# Patient Record
Sex: Female | Born: 1955
Health system: Southern US, Community
[De-identification: ages and names within clinical notes are randomized; demographics above are authoritative.]

---

## 1999-07-12 ENCOUNTER — Other Ambulatory Visit: Admission: RE | Admit: 1999-07-12 | Discharge: 1999-07-12 | Payer: Self-pay | Admitting: Obstetrics and Gynecology

## 2001-07-19 ENCOUNTER — Encounter: Admission: RE | Admit: 2001-07-19 | Discharge: 2001-07-19 | Payer: Self-pay | Admitting: Obstetrics and Gynecology

## 2001-07-19 ENCOUNTER — Encounter: Payer: Self-pay | Admitting: Obstetrics and Gynecology

## 2001-10-22 ENCOUNTER — Encounter: Admission: RE | Admit: 2001-10-22 | Discharge: 2002-01-20 | Payer: Self-pay | Admitting: Obstetrics and Gynecology

## 2002-08-05 ENCOUNTER — Encounter: Admission: RE | Admit: 2002-08-05 | Discharge: 2002-08-05 | Payer: Self-pay | Admitting: Obstetrics and Gynecology

## 2002-08-05 ENCOUNTER — Encounter: Payer: Self-pay | Admitting: Obstetrics and Gynecology

## 2003-09-23 ENCOUNTER — Encounter: Admission: RE | Admit: 2003-09-23 | Discharge: 2003-09-23 | Payer: Self-pay | Admitting: Obstetrics and Gynecology

## 2003-09-23 ENCOUNTER — Encounter: Payer: Self-pay | Admitting: Obstetrics and Gynecology

## 2004-04-11 ENCOUNTER — Other Ambulatory Visit: Admission: RE | Admit: 2004-04-11 | Discharge: 2004-04-11 | Payer: Self-pay | Admitting: Obstetrics and Gynecology

## 2005-02-06 ENCOUNTER — Encounter: Admission: RE | Admit: 2005-02-06 | Discharge: 2005-02-06 | Payer: Self-pay | Admitting: Obstetrics and Gynecology

## 2005-04-17 ENCOUNTER — Other Ambulatory Visit: Admission: RE | Admit: 2005-04-17 | Discharge: 2005-04-17 | Payer: Self-pay | Admitting: Obstetrics and Gynecology

## 2005-08-18 ENCOUNTER — Encounter: Admission: RE | Admit: 2005-08-18 | Discharge: 2005-11-16 | Payer: Self-pay | Admitting: Family Medicine

## 2006-02-13 ENCOUNTER — Encounter: Admission: RE | Admit: 2006-02-13 | Discharge: 2006-02-13 | Payer: Self-pay | Admitting: Obstetrics and Gynecology

## 2006-04-17 ENCOUNTER — Other Ambulatory Visit: Admission: RE | Admit: 2006-04-17 | Discharge: 2006-04-17 | Payer: Self-pay | Admitting: Obstetrics and Gynecology

## 2007-02-20 ENCOUNTER — Encounter: Admission: RE | Admit: 2007-02-20 | Discharge: 2007-02-20 | Payer: Self-pay | Admitting: Obstetrics and Gynecology

## 2007-04-23 ENCOUNTER — Other Ambulatory Visit: Admission: RE | Admit: 2007-04-23 | Discharge: 2007-04-23 | Payer: Self-pay | Admitting: Obstetrics and Gynecology

## 2008-02-13 ENCOUNTER — Encounter: Payer: Self-pay | Admitting: Endocrinology

## 2008-02-18 ENCOUNTER — Encounter: Payer: Self-pay | Admitting: Endocrinology

## 2008-03-12 DIAGNOSIS — G609 Hereditary and idiopathic neuropathy, unspecified: Secondary | ICD-10-CM | POA: Insufficient documentation

## 2008-03-12 DIAGNOSIS — E785 Hyperlipidemia, unspecified: Secondary | ICD-10-CM | POA: Insufficient documentation

## 2008-03-13 ENCOUNTER — Encounter: Payer: Self-pay | Admitting: Endocrinology

## 2008-03-13 ENCOUNTER — Other Ambulatory Visit: Admission: RE | Admit: 2008-03-13 | Discharge: 2008-03-13 | Payer: Self-pay | Admitting: Endocrinology

## 2008-03-13 ENCOUNTER — Ambulatory Visit: Payer: Self-pay | Admitting: Endocrinology

## 2008-03-13 DIAGNOSIS — E079 Disorder of thyroid, unspecified: Secondary | ICD-10-CM | POA: Insufficient documentation

## 2008-03-17 ENCOUNTER — Encounter (INDEPENDENT_AMBULATORY_CARE_PROVIDER_SITE_OTHER): Payer: Self-pay | Admitting: *Deleted

## 2008-03-18 ENCOUNTER — Telehealth: Payer: Self-pay | Admitting: Endocrinology

## 2008-03-31 ENCOUNTER — Encounter: Admission: RE | Admit: 2008-03-31 | Discharge: 2008-03-31 | Payer: Self-pay | Admitting: Obstetrics and Gynecology

## 2008-04-23 ENCOUNTER — Other Ambulatory Visit: Admission: RE | Admit: 2008-04-23 | Discharge: 2008-04-23 | Payer: Self-pay | Admitting: Obstetrics and Gynecology

## 2008-05-05 ENCOUNTER — Encounter: Payer: Self-pay | Admitting: Endocrinology

## 2008-09-29 ENCOUNTER — Encounter: Admission: RE | Admit: 2008-09-29 | Discharge: 2008-09-29 | Payer: Self-pay | Admitting: Surgery

## 2008-10-13 ENCOUNTER — Encounter: Payer: Self-pay | Admitting: Endocrinology

## 2009-04-05 ENCOUNTER — Encounter: Admission: RE | Admit: 2009-04-05 | Discharge: 2009-04-05 | Payer: Self-pay | Admitting: Internal Medicine

## 2009-04-26 ENCOUNTER — Other Ambulatory Visit: Admission: RE | Admit: 2009-04-26 | Discharge: 2009-04-26 | Payer: Self-pay | Admitting: Obstetrics and Gynecology

## 2009-05-03 ENCOUNTER — Encounter: Admission: RE | Admit: 2009-05-03 | Discharge: 2009-05-03 | Payer: Self-pay | Admitting: Obstetrics and Gynecology

## 2009-09-20 ENCOUNTER — Encounter: Admission: RE | Admit: 2009-09-20 | Discharge: 2009-09-20 | Payer: Self-pay | Admitting: Internal Medicine

## 2010-04-13 ENCOUNTER — Emergency Department (HOSPITAL_COMMUNITY): Admission: EM | Admit: 2010-04-13 | Discharge: 2010-04-13 | Payer: Self-pay | Admitting: Emergency Medicine

## 2010-05-30 ENCOUNTER — Other Ambulatory Visit: Admission: RE | Admit: 2010-05-30 | Discharge: 2010-05-30 | Payer: Self-pay | Admitting: Obstetrics and Gynecology

## 2010-05-30 ENCOUNTER — Encounter: Admission: RE | Admit: 2010-05-30 | Discharge: 2010-05-30 | Payer: Self-pay | Admitting: Obstetrics and Gynecology

## 2010-08-30 ENCOUNTER — Encounter: Admission: RE | Admit: 2010-08-30 | Discharge: 2010-08-30 | Payer: Self-pay | Admitting: Internal Medicine

## 2011-03-14 LAB — DIFFERENTIAL
Basophils Relative: 1 % (ref 0–1)
Eosinophils Relative: 2 % (ref 0–5)
Lymphocytes Relative: 31 % (ref 12–46)
Lymphs Abs: 1.5 10*3/uL (ref 0.7–4.0)
Monocytes Relative: 12 % (ref 3–12)
Neutro Abs: 2.6 10*3/uL (ref 1.7–7.7)

## 2011-03-14 LAB — BASIC METABOLIC PANEL
BUN: 8 mg/dL (ref 6–23)
Chloride: 109 mEq/L (ref 96–112)
Creatinine, Ser: 0.88 mg/dL (ref 0.4–1.2)

## 2011-03-14 LAB — POCT CARDIAC MARKERS
CKMB, poc: 1.1 ng/mL (ref 1.0–8.0)
CKMB, poc: 2.5 ng/mL (ref 1.0–8.0)
Myoglobin, poc: 69.5 ng/mL (ref 12–200)
Myoglobin, poc: 75.9 ng/mL (ref 12–200)

## 2011-03-14 LAB — CBC
MCV: 86.6 fL (ref 78.0–100.0)
RDW: 13.2 % (ref 11.5–15.5)

## 2011-06-01 ENCOUNTER — Other Ambulatory Visit: Payer: Self-pay | Admitting: Obstetrics and Gynecology

## 2011-06-01 ENCOUNTER — Other Ambulatory Visit (HOSPITAL_COMMUNITY)
Admission: RE | Admit: 2011-06-01 | Discharge: 2011-06-01 | Disposition: A | Discharge status: Home / Self Care | Payer: BC Managed Care – PPO | Source: Ambulatory Visit | Attending: Obstetrics and Gynecology | Admitting: Obstetrics and Gynecology

## 2011-06-01 DIAGNOSIS — Z01419 Encounter for gynecological examination (general) (routine) without abnormal findings: Secondary | ICD-10-CM | POA: Insufficient documentation

## 2011-06-12 ENCOUNTER — Other Ambulatory Visit: Payer: Self-pay | Admitting: Obstetrics and Gynecology

## 2011-06-12 DIAGNOSIS — Z1231 Encounter for screening mammogram for malignant neoplasm of breast: Secondary | ICD-10-CM

## 2011-06-19 ENCOUNTER — Ambulatory Visit
Admission: RE | Admit: 2011-06-19 | Discharge: 2011-06-19 | Disposition: A | Discharge status: Home / Self Care | Payer: BC Managed Care – PPO | Source: Ambulatory Visit | Attending: Obstetrics and Gynecology | Admitting: Obstetrics and Gynecology

## 2011-06-19 DIAGNOSIS — Z1231 Encounter for screening mammogram for malignant neoplasm of breast: Secondary | ICD-10-CM

## 2011-09-11 ENCOUNTER — Other Ambulatory Visit: Payer: Self-pay | Admitting: Internal Medicine

## 2011-09-11 DIAGNOSIS — E041 Nontoxic single thyroid nodule: Secondary | ICD-10-CM

## 2011-09-19 ENCOUNTER — Ambulatory Visit
Admission: RE | Admit: 2011-09-19 | Discharge: 2011-09-19 | Disposition: A | Discharge status: Home / Self Care | Payer: BC Managed Care – PPO | Source: Ambulatory Visit | Attending: Internal Medicine | Admitting: Internal Medicine

## 2011-09-19 DIAGNOSIS — E041 Nontoxic single thyroid nodule: Secondary | ICD-10-CM

## 2012-05-14 ENCOUNTER — Other Ambulatory Visit: Payer: Self-pay | Admitting: Obstetrics and Gynecology

## 2012-05-14 DIAGNOSIS — Z1231 Encounter for screening mammogram for malignant neoplasm of breast: Secondary | ICD-10-CM

## 2012-06-04 ENCOUNTER — Other Ambulatory Visit (HOSPITAL_COMMUNITY)
Admission: RE | Admit: 2012-06-04 | Discharge: 2012-06-04 | Disposition: A | Discharge status: Home / Self Care | Payer: BC Managed Care – PPO | Source: Ambulatory Visit | Attending: Obstetrics and Gynecology | Admitting: Obstetrics and Gynecology

## 2012-06-04 ENCOUNTER — Other Ambulatory Visit: Payer: Self-pay | Admitting: Obstetrics and Gynecology

## 2012-06-04 DIAGNOSIS — Z01419 Encounter for gynecological examination (general) (routine) without abnormal findings: Secondary | ICD-10-CM | POA: Insufficient documentation

## 2012-06-24 ENCOUNTER — Ambulatory Visit: Payer: BC Managed Care – PPO

## 2012-06-24 ENCOUNTER — Ambulatory Visit
Admission: RE | Admit: 2012-06-24 | Discharge: 2012-06-24 | Disposition: A | Discharge status: Home / Self Care | Payer: BC Managed Care – PPO | Source: Ambulatory Visit | Attending: Obstetrics and Gynecology | Admitting: Obstetrics and Gynecology

## 2012-06-24 DIAGNOSIS — Z1231 Encounter for screening mammogram for malignant neoplasm of breast: Secondary | ICD-10-CM

## 2013-05-27 ENCOUNTER — Other Ambulatory Visit: Payer: Self-pay

## 2013-05-27 DIAGNOSIS — Z1231 Encounter for screening mammogram for malignant neoplasm of breast: Secondary | ICD-10-CM

## 2013-06-30 ENCOUNTER — Ambulatory Visit
Admission: RE | Admit: 2013-06-30 | Discharge: 2013-06-30 | Disposition: A | Discharge status: Home / Self Care | Payer: BC Managed Care – PPO | Source: Ambulatory Visit

## 2013-06-30 DIAGNOSIS — Z1231 Encounter for screening mammogram for malignant neoplasm of breast: Secondary | ICD-10-CM

## 2013-10-14 ENCOUNTER — Other Ambulatory Visit: Payer: Self-pay | Admitting: Internal Medicine

## 2013-10-14 DIAGNOSIS — E041 Nontoxic single thyroid nodule: Secondary | ICD-10-CM

## 2013-10-29 ENCOUNTER — Ambulatory Visit
Admission: RE | Admit: 2013-10-29 | Discharge: 2013-10-29 | Disposition: A | Discharge status: Home / Self Care | Payer: BC Managed Care – PPO | Source: Ambulatory Visit | Attending: Internal Medicine | Admitting: Internal Medicine

## 2013-10-29 DIAGNOSIS — E041 Nontoxic single thyroid nodule: Secondary | ICD-10-CM

## 2014-06-05 ENCOUNTER — Other Ambulatory Visit: Payer: Self-pay

## 2014-06-05 DIAGNOSIS — Z1231 Encounter for screening mammogram for malignant neoplasm of breast: Secondary | ICD-10-CM

## 2014-07-06 ENCOUNTER — Ambulatory Visit
Admission: RE | Admit: 2014-07-06 | Discharge: 2014-07-06 | Disposition: A | Discharge status: Home / Self Care | Payer: BC Managed Care – PPO | Source: Ambulatory Visit

## 2014-07-06 DIAGNOSIS — Z1231 Encounter for screening mammogram for malignant neoplasm of breast: Secondary | ICD-10-CM

## 2014-09-16 ENCOUNTER — Other Ambulatory Visit: Payer: Self-pay

## 2015-06-17 ENCOUNTER — Other Ambulatory Visit: Payer: Self-pay

## 2015-06-17 DIAGNOSIS — Z1231 Encounter for screening mammogram for malignant neoplasm of breast: Secondary | ICD-10-CM

## 2015-07-08 ENCOUNTER — Other Ambulatory Visit (HOSPITAL_COMMUNITY)
Admission: RE | Admit: 2015-07-08 | Discharge: 2015-07-08 | Disposition: A | Discharge status: Home / Self Care | Payer: BLUE CROSS/BLUE SHIELD | Source: Ambulatory Visit | Attending: Nurse Practitioner | Admitting: Nurse Practitioner

## 2015-07-08 ENCOUNTER — Ambulatory Visit
Admission: RE | Admit: 2015-07-08 | Discharge: 2015-07-08 | Disposition: A | Discharge status: Home / Self Care | Payer: BLUE CROSS/BLUE SHIELD | Source: Ambulatory Visit

## 2015-07-08 ENCOUNTER — Other Ambulatory Visit: Payer: Self-pay | Admitting: Nurse Practitioner

## 2015-07-08 DIAGNOSIS — Z1231 Encounter for screening mammogram for malignant neoplasm of breast: Secondary | ICD-10-CM

## 2015-07-08 DIAGNOSIS — Z01419 Encounter for gynecological examination (general) (routine) without abnormal findings: Secondary | ICD-10-CM | POA: Insufficient documentation

## 2015-07-08 DIAGNOSIS — Z1151 Encounter for screening for human papillomavirus (HPV): Secondary | ICD-10-CM | POA: Insufficient documentation

## 2015-07-12 LAB — CYTOLOGY - PAP

## 2016-06-15 ENCOUNTER — Other Ambulatory Visit: Payer: Self-pay | Admitting: Family Medicine

## 2016-06-15 DIAGNOSIS — Z1231 Encounter for screening mammogram for malignant neoplasm of breast: Secondary | ICD-10-CM

## 2016-07-11 ENCOUNTER — Ambulatory Visit
Admission: RE | Admit: 2016-07-11 | Discharge: 2016-07-11 | Disposition: A | Discharge status: Home / Self Care | Payer: BLUE CROSS/BLUE SHIELD | Source: Ambulatory Visit | Attending: Family Medicine | Admitting: Family Medicine

## 2016-07-11 DIAGNOSIS — Z1231 Encounter for screening mammogram for malignant neoplasm of breast: Secondary | ICD-10-CM | POA: Diagnosis not present

## 2016-07-11 DIAGNOSIS — Z01419 Encounter for gynecological examination (general) (routine) without abnormal findings: Secondary | ICD-10-CM | POA: Diagnosis not present

## 2016-09-22 DIAGNOSIS — E559 Vitamin D deficiency, unspecified: Secondary | ICD-10-CM | POA: Diagnosis not present

## 2016-09-22 DIAGNOSIS — Z79899 Other long term (current) drug therapy: Secondary | ICD-10-CM | POA: Diagnosis not present

## 2016-09-22 DIAGNOSIS — Z0100 Encounter for examination of eyes and vision without abnormal findings: Secondary | ICD-10-CM | POA: Diagnosis not present

## 2016-09-22 DIAGNOSIS — E7800 Pure hypercholesterolemia, unspecified: Secondary | ICD-10-CM | POA: Diagnosis not present

## 2016-09-22 DIAGNOSIS — J019 Acute sinusitis, unspecified: Secondary | ICD-10-CM | POA: Diagnosis not present

## 2016-10-17 ENCOUNTER — Other Ambulatory Visit: Payer: Self-pay | Admitting: Internal Medicine

## 2016-10-17 DIAGNOSIS — E063 Autoimmune thyroiditis: Secondary | ICD-10-CM

## 2016-10-17 DIAGNOSIS — E041 Nontoxic single thyroid nodule: Secondary | ICD-10-CM

## 2016-10-27 ENCOUNTER — Ambulatory Visit
Admission: RE | Admit: 2016-10-27 | Discharge: 2016-10-27 | Disposition: A | Discharge status: Home / Self Care | Payer: BLUE CROSS/BLUE SHIELD | Source: Ambulatory Visit | Attending: Internal Medicine | Admitting: Internal Medicine

## 2016-10-27 DIAGNOSIS — E041 Nontoxic single thyroid nodule: Secondary | ICD-10-CM | POA: Diagnosis not present

## 2016-10-27 DIAGNOSIS — E063 Autoimmune thyroiditis: Secondary | ICD-10-CM

## 2016-11-28 DIAGNOSIS — E041 Nontoxic single thyroid nodule: Secondary | ICD-10-CM | POA: Diagnosis not present

## 2016-11-28 DIAGNOSIS — E063 Autoimmune thyroiditis: Secondary | ICD-10-CM | POA: Diagnosis not present

## 2016-11-28 DIAGNOSIS — E039 Hypothyroidism, unspecified: Secondary | ICD-10-CM | POA: Diagnosis not present

## 2017-02-09 ENCOUNTER — Encounter: Payer: Self-pay | Admitting: Podiatry

## 2017-02-09 ENCOUNTER — Ambulatory Visit (INDEPENDENT_AMBULATORY_CARE_PROVIDER_SITE_OTHER): Payer: BLUE CROSS/BLUE SHIELD | Admitting: Podiatry

## 2017-02-09 VITALS — Ht 65.0 in | Wt 142.0 lb

## 2017-02-09 DIAGNOSIS — L600 Ingrowing nail: Secondary | ICD-10-CM

## 2017-02-09 NOTE — Patient Instructions (Signed)

## 2017-02-10 NOTE — Progress Notes (Signed)
Subjective:     Patient ID: Debbie Cooper, female   DOB: 02/15/1956, 61 y.o.   MRN: [id number]  HPI patient presents with a chronic ingrown toenail right hallux medial border that she states is been present along time and she's tried to soak and trimming herself without results   Review of Systems  All other systems reviewed and are negative.      Objective:   Physical Exam  Constitutional: She is oriented to person, place, and time.  Cardiovascular: Intact distal pulses.   Musculoskeletal: Normal range of motion.  Neurological: She is oriented to person, place, and time.  Skin: Skin is warm.  Nursing note and vitals reviewed.  neurovascular status found to be intact muscle strength was adequate with patient found to have inflammation and pain of the medial border right hallux with incurvation of the beds and pain when palpated with distal redness but no active drainage noted. Patient's found have good digital perfusion and is well oriented 3     Assessment:     Ingrown toenail deformity right hallux medial border that's painful when pressed    Plan:     H&P condition reviewed and recommended removal of the nail border. I explained procedure and risks today I infiltrated 60 mg like Marcaine mixture remove the border exposed matrix and applied phenol 3 applications 30 seconds followed by alcohol lavage and sterile dressing. Gave instructions on soaks and reappoint

## 2017-02-19 ENCOUNTER — Telehealth: Payer: Self-pay | Admitting: *Deleted

## 2017-02-19 NOTE — Telephone Encounter (Signed)
Pt states she had a toenail removed about a week ago and it is still sore. I left message explaining to pt that the toenail area would be tender to varying degrees on and off for the next 4-6 weeks, and that the further she got from the surgery date the more the symptoms of redness, stinging and oozing would decrease. I instructed pt to continue the epsom salt soaks 1/2 cup to 1 qt of warm water 2 xday for 20 minutes each soak, then pat dry and put on a lightly coated antibiotic ointment bandaid. I told her at about the end of the 4th week do the last soak of the day and pat dry allow to air dry without the ointment or bandaid, if the area got a dry hard scab without redness, pain or oozing the soaks could stop, if not continue another 2 weeks and test again. I encouraged to pt if she was able to take OTC ibuprofen then she could take that and the package instructs because this is an inflammatory process. I encouraged pt to call with concerns.

## 2017-05-11 DIAGNOSIS — R6881 Early satiety: Secondary | ICD-10-CM | POA: Diagnosis not present

## 2017-05-11 DIAGNOSIS — K219 Gastro-esophageal reflux disease without esophagitis: Secondary | ICD-10-CM | POA: Diagnosis not present

## 2017-06-04 ENCOUNTER — Other Ambulatory Visit: Payer: Self-pay | Admitting: Family Medicine

## 2017-06-04 DIAGNOSIS — Z1231 Encounter for screening mammogram for malignant neoplasm of breast: Secondary | ICD-10-CM

## 2017-07-03 DIAGNOSIS — M25551 Pain in right hip: Secondary | ICD-10-CM | POA: Diagnosis not present

## 2017-07-03 DIAGNOSIS — M7061 Trochanteric bursitis, right hip: Secondary | ICD-10-CM | POA: Diagnosis not present

## 2017-07-17 ENCOUNTER — Ambulatory Visit
Admission: RE | Admit: 2017-07-17 | Discharge: 2017-07-17 | Disposition: A | Discharge status: Home / Self Care | Payer: BLUE CROSS/BLUE SHIELD | Source: Ambulatory Visit | Attending: Family Medicine | Admitting: Family Medicine

## 2017-07-17 DIAGNOSIS — Z01419 Encounter for gynecological examination (general) (routine) without abnormal findings: Secondary | ICD-10-CM | POA: Diagnosis not present

## 2017-07-17 DIAGNOSIS — Z1231 Encounter for screening mammogram for malignant neoplasm of breast: Secondary | ICD-10-CM | POA: Diagnosis not present

## 2017-12-20 DIAGNOSIS — E7800 Pure hypercholesterolemia, unspecified: Secondary | ICD-10-CM | POA: Diagnosis not present

## 2017-12-20 DIAGNOSIS — E039 Hypothyroidism, unspecified: Secondary | ICD-10-CM | POA: Diagnosis not present

## 2017-12-20 DIAGNOSIS — E559 Vitamin D deficiency, unspecified: Secondary | ICD-10-CM | POA: Diagnosis not present

## 2018-01-16 DIAGNOSIS — E063 Autoimmune thyroiditis: Secondary | ICD-10-CM | POA: Diagnosis not present

## 2018-01-16 DIAGNOSIS — E041 Nontoxic single thyroid nodule: Secondary | ICD-10-CM | POA: Diagnosis not present

## 2018-01-16 DIAGNOSIS — E039 Hypothyroidism, unspecified: Secondary | ICD-10-CM | POA: Diagnosis not present

## 2018-02-12 DIAGNOSIS — M8588 Other specified disorders of bone density and structure, other site: Secondary | ICD-10-CM | POA: Diagnosis not present

## 2018-02-12 DIAGNOSIS — M810 Age-related osteoporosis without current pathological fracture: Secondary | ICD-10-CM | POA: Diagnosis not present

## 2018-03-19 DIAGNOSIS — M810 Age-related osteoporosis without current pathological fracture: Secondary | ICD-10-CM | POA: Diagnosis not present

## 2018-04-17 DIAGNOSIS — M9901 Segmental and somatic dysfunction of cervical region: Secondary | ICD-10-CM | POA: Diagnosis not present

## 2018-04-17 DIAGNOSIS — M546 Pain in thoracic spine: Secondary | ICD-10-CM | POA: Diagnosis not present

## 2018-04-17 DIAGNOSIS — M542 Cervicalgia: Secondary | ICD-10-CM | POA: Diagnosis not present

## 2018-04-17 DIAGNOSIS — M9902 Segmental and somatic dysfunction of thoracic region: Secondary | ICD-10-CM | POA: Diagnosis not present

## 2018-05-15 DIAGNOSIS — K219 Gastro-esophageal reflux disease without esophagitis: Secondary | ICD-10-CM | POA: Diagnosis not present

## 2018-07-02 ENCOUNTER — Other Ambulatory Visit: Payer: Self-pay | Admitting: Family Medicine

## 2018-07-02 DIAGNOSIS — Z23 Encounter for immunization: Secondary | ICD-10-CM | POA: Diagnosis not present

## 2018-07-02 DIAGNOSIS — R7303 Prediabetes: Secondary | ICD-10-CM | POA: Diagnosis not present

## 2018-07-02 DIAGNOSIS — E7800 Pure hypercholesterolemia, unspecified: Secondary | ICD-10-CM | POA: Diagnosis not present

## 2018-07-02 DIAGNOSIS — Z0000 Encounter for general adult medical examination without abnormal findings: Secondary | ICD-10-CM | POA: Diagnosis not present

## 2018-07-02 DIAGNOSIS — Z1231 Encounter for screening mammogram for malignant neoplasm of breast: Secondary | ICD-10-CM

## 2018-07-02 DIAGNOSIS — M810 Age-related osteoporosis without current pathological fracture: Secondary | ICD-10-CM | POA: Diagnosis not present

## 2018-07-02 DIAGNOSIS — E559 Vitamin D deficiency, unspecified: Secondary | ICD-10-CM | POA: Diagnosis not present

## 2018-08-13 ENCOUNTER — Other Ambulatory Visit: Payer: Self-pay | Admitting: Nurse Practitioner

## 2018-08-13 ENCOUNTER — Ambulatory Visit
Admission: RE | Admit: 2018-08-13 | Discharge: 2018-08-13 | Disposition: A | Discharge status: Home / Self Care | Payer: BLUE CROSS/BLUE SHIELD | Source: Ambulatory Visit | Attending: Family Medicine | Admitting: Family Medicine

## 2018-08-13 ENCOUNTER — Other Ambulatory Visit (HOSPITAL_COMMUNITY)
Admission: RE | Admit: 2018-08-13 | Discharge: 2018-08-13 | Disposition: A | Discharge status: Home / Self Care | Payer: BLUE CROSS/BLUE SHIELD | Source: Ambulatory Visit | Attending: Nurse Practitioner | Admitting: Nurse Practitioner

## 2018-08-13 DIAGNOSIS — Z1231 Encounter for screening mammogram for malignant neoplasm of breast: Secondary | ICD-10-CM

## 2018-08-13 DIAGNOSIS — Z01419 Encounter for gynecological examination (general) (routine) without abnormal findings: Secondary | ICD-10-CM | POA: Insufficient documentation

## 2018-08-15 LAB — CYTOLOGY - PAP
DIAGNOSIS: NEGATIVE
HPV: NOT DETECTED

## 2018-08-27 DIAGNOSIS — N952 Postmenopausal atrophic vaginitis: Secondary | ICD-10-CM | POA: Diagnosis not present

## 2018-10-02 DIAGNOSIS — R7303 Prediabetes: Secondary | ICD-10-CM | POA: Diagnosis not present

## 2018-10-29 ENCOUNTER — Other Ambulatory Visit: Payer: Self-pay | Admitting: Internal Medicine

## 2018-10-29 DIAGNOSIS — E041 Nontoxic single thyroid nodule: Secondary | ICD-10-CM

## 2018-11-07 ENCOUNTER — Other Ambulatory Visit: Payer: BLUE CROSS/BLUE SHIELD

## 2018-11-12 ENCOUNTER — Ambulatory Visit
Admission: RE | Admit: 2018-11-12 | Discharge: 2018-11-12 | Disposition: A | Discharge status: Home / Self Care | Payer: BLUE CROSS/BLUE SHIELD | Source: Ambulatory Visit | Attending: Internal Medicine | Admitting: Internal Medicine

## 2018-11-12 DIAGNOSIS — E041 Nontoxic single thyroid nodule: Secondary | ICD-10-CM

## 2019-01-17 DIAGNOSIS — E063 Autoimmune thyroiditis: Secondary | ICD-10-CM | POA: Diagnosis not present

## 2019-01-17 DIAGNOSIS — E039 Hypothyroidism, unspecified: Secondary | ICD-10-CM | POA: Diagnosis not present

## 2019-01-17 DIAGNOSIS — E041 Nontoxic single thyroid nodule: Secondary | ICD-10-CM | POA: Diagnosis not present

## 2019-04-27 DIAGNOSIS — H8110 Benign paroxysmal vertigo, unspecified ear: Secondary | ICD-10-CM | POA: Diagnosis not present

## 2019-07-03 DIAGNOSIS — M9901 Segmental and somatic dysfunction of cervical region: Secondary | ICD-10-CM | POA: Diagnosis not present

## 2019-07-03 DIAGNOSIS — M9902 Segmental and somatic dysfunction of thoracic region: Secondary | ICD-10-CM | POA: Diagnosis not present

## 2019-07-03 DIAGNOSIS — M546 Pain in thoracic spine: Secondary | ICD-10-CM | POA: Diagnosis not present

## 2019-07-03 DIAGNOSIS — M542 Cervicalgia: Secondary | ICD-10-CM | POA: Diagnosis not present

## 2019-07-07 ENCOUNTER — Other Ambulatory Visit: Payer: Self-pay | Admitting: Family Medicine

## 2019-07-07 DIAGNOSIS — Z1231 Encounter for screening mammogram for malignant neoplasm of breast: Secondary | ICD-10-CM

## 2019-07-08 DIAGNOSIS — M542 Cervicalgia: Secondary | ICD-10-CM | POA: Diagnosis not present

## 2019-07-08 DIAGNOSIS — M9902 Segmental and somatic dysfunction of thoracic region: Secondary | ICD-10-CM | POA: Diagnosis not present

## 2019-07-08 DIAGNOSIS — M546 Pain in thoracic spine: Secondary | ICD-10-CM | POA: Diagnosis not present

## 2019-07-08 DIAGNOSIS — M9901 Segmental and somatic dysfunction of cervical region: Secondary | ICD-10-CM | POA: Diagnosis not present

## 2019-07-15 DIAGNOSIS — M542 Cervicalgia: Secondary | ICD-10-CM | POA: Diagnosis not present

## 2019-07-15 DIAGNOSIS — M9901 Segmental and somatic dysfunction of cervical region: Secondary | ICD-10-CM | POA: Diagnosis not present

## 2019-07-15 DIAGNOSIS — M546 Pain in thoracic spine: Secondary | ICD-10-CM | POA: Diagnosis not present

## 2019-07-15 DIAGNOSIS — M9902 Segmental and somatic dysfunction of thoracic region: Secondary | ICD-10-CM | POA: Diagnosis not present

## 2019-07-22 DIAGNOSIS — M9901 Segmental and somatic dysfunction of cervical region: Secondary | ICD-10-CM | POA: Diagnosis not present

## 2019-07-22 DIAGNOSIS — M9902 Segmental and somatic dysfunction of thoracic region: Secondary | ICD-10-CM | POA: Diagnosis not present

## 2019-07-22 DIAGNOSIS — M546 Pain in thoracic spine: Secondary | ICD-10-CM | POA: Diagnosis not present

## 2019-07-22 DIAGNOSIS — M542 Cervicalgia: Secondary | ICD-10-CM | POA: Diagnosis not present

## 2019-08-05 DIAGNOSIS — E559 Vitamin D deficiency, unspecified: Secondary | ICD-10-CM | POA: Diagnosis not present

## 2019-08-05 DIAGNOSIS — M542 Cervicalgia: Secondary | ICD-10-CM | POA: Diagnosis not present

## 2019-08-05 DIAGNOSIS — R7303 Prediabetes: Secondary | ICD-10-CM | POA: Diagnosis not present

## 2019-08-05 DIAGNOSIS — E7800 Pure hypercholesterolemia, unspecified: Secondary | ICD-10-CM | POA: Diagnosis not present

## 2019-08-05 DIAGNOSIS — M9902 Segmental and somatic dysfunction of thoracic region: Secondary | ICD-10-CM | POA: Diagnosis not present

## 2019-08-05 DIAGNOSIS — M9901 Segmental and somatic dysfunction of cervical region: Secondary | ICD-10-CM | POA: Diagnosis not present

## 2019-08-05 DIAGNOSIS — M546 Pain in thoracic spine: Secondary | ICD-10-CM | POA: Diagnosis not present

## 2019-08-19 ENCOUNTER — Ambulatory Visit
Admission: RE | Admit: 2019-08-19 | Discharge: 2019-08-19 | Disposition: A | Discharge status: Home / Self Care | Payer: BC Managed Care – PPO | Source: Ambulatory Visit | Attending: Family Medicine | Admitting: Family Medicine

## 2019-08-19 ENCOUNTER — Other Ambulatory Visit: Payer: Self-pay

## 2019-08-19 DIAGNOSIS — E7800 Pure hypercholesterolemia, unspecified: Secondary | ICD-10-CM | POA: Diagnosis not present

## 2019-08-19 DIAGNOSIS — Z1231 Encounter for screening mammogram for malignant neoplasm of breast: Secondary | ICD-10-CM

## 2019-08-19 DIAGNOSIS — R7303 Prediabetes: Secondary | ICD-10-CM | POA: Diagnosis not present

## 2019-08-19 DIAGNOSIS — Z01419 Encounter for gynecological examination (general) (routine) without abnormal findings: Secondary | ICD-10-CM | POA: Diagnosis not present

## 2019-08-19 DIAGNOSIS — E559 Vitamin D deficiency, unspecified: Secondary | ICD-10-CM | POA: Diagnosis not present

## 2019-08-21 DIAGNOSIS — M9902 Segmental and somatic dysfunction of thoracic region: Secondary | ICD-10-CM | POA: Diagnosis not present

## 2019-08-21 DIAGNOSIS — M542 Cervicalgia: Secondary | ICD-10-CM | POA: Diagnosis not present

## 2019-08-21 DIAGNOSIS — M546 Pain in thoracic spine: Secondary | ICD-10-CM | POA: Diagnosis not present

## 2019-08-21 DIAGNOSIS — M9901 Segmental and somatic dysfunction of cervical region: Secondary | ICD-10-CM | POA: Diagnosis not present

## 2019-08-27 ENCOUNTER — Other Ambulatory Visit: Payer: Self-pay | Admitting: Nurse Practitioner

## 2019-08-27 DIAGNOSIS — M810 Age-related osteoporosis without current pathological fracture: Secondary | ICD-10-CM

## 2019-09-08 DIAGNOSIS — M542 Cervicalgia: Secondary | ICD-10-CM | POA: Diagnosis not present

## 2019-09-08 DIAGNOSIS — M546 Pain in thoracic spine: Secondary | ICD-10-CM | POA: Diagnosis not present

## 2019-09-08 DIAGNOSIS — M9902 Segmental and somatic dysfunction of thoracic region: Secondary | ICD-10-CM | POA: Diagnosis not present

## 2019-09-08 DIAGNOSIS — M9901 Segmental and somatic dysfunction of cervical region: Secondary | ICD-10-CM | POA: Diagnosis not present

## 2019-10-09 DIAGNOSIS — M940 Chondrocostal junction syndrome [Tietze]: Secondary | ICD-10-CM | POA: Diagnosis not present

## 2019-10-09 DIAGNOSIS — F418 Other specified anxiety disorders: Secondary | ICD-10-CM | POA: Diagnosis not present

## 2019-10-09 DIAGNOSIS — R079 Chest pain, unspecified: Secondary | ICD-10-CM | POA: Diagnosis not present

## 2019-10-14 DIAGNOSIS — M546 Pain in thoracic spine: Secondary | ICD-10-CM | POA: Diagnosis not present

## 2019-10-14 DIAGNOSIS — M542 Cervicalgia: Secondary | ICD-10-CM | POA: Diagnosis not present

## 2019-10-14 DIAGNOSIS — M9902 Segmental and somatic dysfunction of thoracic region: Secondary | ICD-10-CM | POA: Diagnosis not present

## 2019-10-14 DIAGNOSIS — M9901 Segmental and somatic dysfunction of cervical region: Secondary | ICD-10-CM | POA: Diagnosis not present

## 2019-10-17 DIAGNOSIS — E063 Autoimmune thyroiditis: Secondary | ICD-10-CM | POA: Diagnosis not present

## 2019-11-03 ENCOUNTER — Other Ambulatory Visit: Payer: Self-pay | Admitting: Internal Medicine

## 2019-11-03 DIAGNOSIS — E041 Nontoxic single thyroid nodule: Secondary | ICD-10-CM

## 2019-11-06 ENCOUNTER — Other Ambulatory Visit: Payer: Self-pay | Admitting: Internal Medicine

## 2019-11-06 DIAGNOSIS — E063 Autoimmune thyroiditis: Secondary | ICD-10-CM

## 2019-11-06 DIAGNOSIS — E041 Nontoxic single thyroid nodule: Secondary | ICD-10-CM

## 2019-11-11 ENCOUNTER — Other Ambulatory Visit: Payer: BC Managed Care – PPO

## 2019-11-11 DIAGNOSIS — M546 Pain in thoracic spine: Secondary | ICD-10-CM | POA: Diagnosis not present

## 2019-11-11 DIAGNOSIS — M542 Cervicalgia: Secondary | ICD-10-CM | POA: Diagnosis not present

## 2019-11-11 DIAGNOSIS — M9902 Segmental and somatic dysfunction of thoracic region: Secondary | ICD-10-CM | POA: Diagnosis not present

## 2019-11-11 DIAGNOSIS — M9901 Segmental and somatic dysfunction of cervical region: Secondary | ICD-10-CM | POA: Diagnosis not present

## 2019-11-12 ENCOUNTER — Other Ambulatory Visit: Payer: BC Managed Care – PPO

## 2019-11-13 ENCOUNTER — Ambulatory Visit
Admission: RE | Admit: 2019-11-13 | Discharge: 2019-11-13 | Disposition: A | Discharge status: Home / Self Care | Payer: BC Managed Care – PPO | Source: Ambulatory Visit | Attending: Internal Medicine | Admitting: Internal Medicine

## 2019-11-13 DIAGNOSIS — E063 Autoimmune thyroiditis: Secondary | ICD-10-CM

## 2019-11-13 DIAGNOSIS — E041 Nontoxic single thyroid nodule: Secondary | ICD-10-CM

## 2019-12-09 DIAGNOSIS — M542 Cervicalgia: Secondary | ICD-10-CM | POA: Diagnosis not present

## 2019-12-09 DIAGNOSIS — M9902 Segmental and somatic dysfunction of thoracic region: Secondary | ICD-10-CM | POA: Diagnosis not present

## 2019-12-09 DIAGNOSIS — M546 Pain in thoracic spine: Secondary | ICD-10-CM | POA: Diagnosis not present

## 2019-12-09 DIAGNOSIS — M9901 Segmental and somatic dysfunction of cervical region: Secondary | ICD-10-CM | POA: Diagnosis not present

## 2020-01-12 DIAGNOSIS — M9902 Segmental and somatic dysfunction of thoracic region: Secondary | ICD-10-CM | POA: Diagnosis not present

## 2020-01-12 DIAGNOSIS — M542 Cervicalgia: Secondary | ICD-10-CM | POA: Diagnosis not present

## 2020-01-12 DIAGNOSIS — M9901 Segmental and somatic dysfunction of cervical region: Secondary | ICD-10-CM | POA: Diagnosis not present

## 2020-01-12 DIAGNOSIS — M546 Pain in thoracic spine: Secondary | ICD-10-CM | POA: Diagnosis not present

## 2020-01-19 DIAGNOSIS — M9901 Segmental and somatic dysfunction of cervical region: Secondary | ICD-10-CM | POA: Diagnosis not present

## 2020-01-19 DIAGNOSIS — M9902 Segmental and somatic dysfunction of thoracic region: Secondary | ICD-10-CM | POA: Diagnosis not present

## 2020-01-19 DIAGNOSIS — M542 Cervicalgia: Secondary | ICD-10-CM | POA: Diagnosis not present

## 2020-01-19 DIAGNOSIS — M546 Pain in thoracic spine: Secondary | ICD-10-CM | POA: Diagnosis not present

## 2020-01-23 DIAGNOSIS — E063 Autoimmune thyroiditis: Secondary | ICD-10-CM | POA: Diagnosis not present

## 2020-01-23 DIAGNOSIS — E041 Nontoxic single thyroid nodule: Secondary | ICD-10-CM | POA: Diagnosis not present

## 2020-01-23 DIAGNOSIS — Z7189 Other specified counseling: Secondary | ICD-10-CM | POA: Diagnosis not present

## 2020-01-23 DIAGNOSIS — E039 Hypothyroidism, unspecified: Secondary | ICD-10-CM | POA: Diagnosis not present

## 2020-01-27 DIAGNOSIS — M546 Pain in thoracic spine: Secondary | ICD-10-CM | POA: Diagnosis not present

## 2020-01-27 DIAGNOSIS — M9902 Segmental and somatic dysfunction of thoracic region: Secondary | ICD-10-CM | POA: Diagnosis not present

## 2020-01-27 DIAGNOSIS — M9901 Segmental and somatic dysfunction of cervical region: Secondary | ICD-10-CM | POA: Diagnosis not present

## 2020-01-27 DIAGNOSIS — M542 Cervicalgia: Secondary | ICD-10-CM | POA: Diagnosis not present

## 2020-04-06 DIAGNOSIS — H8111 Benign paroxysmal vertigo, right ear: Secondary | ICD-10-CM | POA: Diagnosis not present

## 2020-04-16 DIAGNOSIS — R42 Dizziness and giddiness: Secondary | ICD-10-CM | POA: Diagnosis not present

## 2020-06-15 ENCOUNTER — Other Ambulatory Visit: Payer: Self-pay | Admitting: Family Medicine

## 2020-06-15 DIAGNOSIS — Z1231 Encounter for screening mammogram for malignant neoplasm of breast: Secondary | ICD-10-CM

## 2020-07-21 ENCOUNTER — Other Ambulatory Visit: Payer: Self-pay

## 2020-07-21 ENCOUNTER — Ambulatory Visit
Admission: RE | Admit: 2020-07-21 | Discharge: 2020-07-21 | Disposition: A | Discharge status: Home / Self Care | Payer: Managed Care, Other (non HMO) | Source: Ambulatory Visit | Attending: Nurse Practitioner | Admitting: Nurse Practitioner

## 2020-07-21 DIAGNOSIS — M810 Age-related osteoporosis without current pathological fracture: Secondary | ICD-10-CM

## 2020-08-24 ENCOUNTER — Ambulatory Visit
Admission: RE | Admit: 2020-08-24 | Discharge: 2020-08-24 | Disposition: A | Discharge status: Home / Self Care | Payer: BC Managed Care – PPO | Source: Ambulatory Visit | Attending: Family Medicine | Admitting: Family Medicine

## 2020-08-24 ENCOUNTER — Other Ambulatory Visit: Payer: Self-pay

## 2020-08-24 DIAGNOSIS — Z1231 Encounter for screening mammogram for malignant neoplasm of breast: Secondary | ICD-10-CM

## 2021-07-15 ENCOUNTER — Other Ambulatory Visit: Payer: Self-pay | Admitting: Physician Assistant

## 2021-07-15 DIAGNOSIS — Z1231 Encounter for screening mammogram for malignant neoplasm of breast: Secondary | ICD-10-CM

## 2021-09-07 ENCOUNTER — Other Ambulatory Visit: Payer: Self-pay

## 2021-09-07 ENCOUNTER — Ambulatory Visit
Admission: RE | Admit: 2021-09-07 | Discharge: 2021-09-07 | Disposition: A | Discharge status: Home / Self Care | Payer: 59 | Source: Ambulatory Visit | Attending: Physician Assistant | Admitting: Physician Assistant

## 2021-09-07 DIAGNOSIS — Z1231 Encounter for screening mammogram for malignant neoplasm of breast: Secondary | ICD-10-CM

## 2022-07-12 ENCOUNTER — Other Ambulatory Visit: Payer: Self-pay | Admitting: Physician Assistant

## 2022-07-12 DIAGNOSIS — Z1231 Encounter for screening mammogram for malignant neoplasm of breast: Secondary | ICD-10-CM

## 2022-07-17 IMAGING — MG MM DIGITAL SCREENING BILAT W/ TOMO AND CAD
8 series · 9 of 24 positions shown · non-contrast
Comparison: Previous exam(s).

CLINICAL DATA: Screening.

EXAM:
DIGITAL SCREENING BILATERAL MAMMOGRAM WITH TOMOSYNTHESIS AND CAD
TECHNIQUE: Bilateral screening digital craniocaudal and mediolateral oblique
mammograms were obtained. Bilateral screening digital breast
tomosynthesis was performed. The images were evaluated with
computer-aided detection.

[L MLO synth-2D]
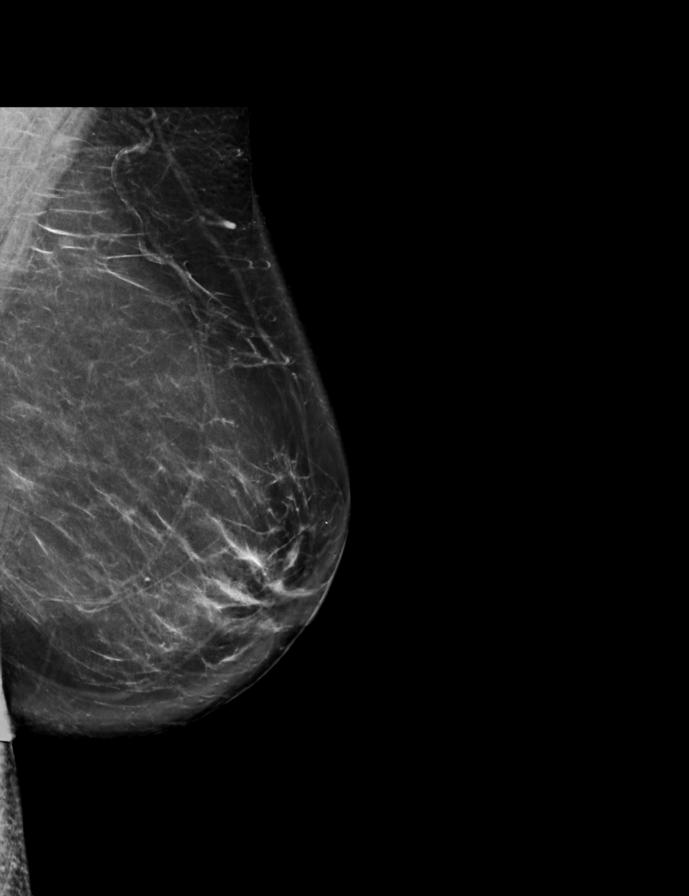

[R MLO synth-2D]
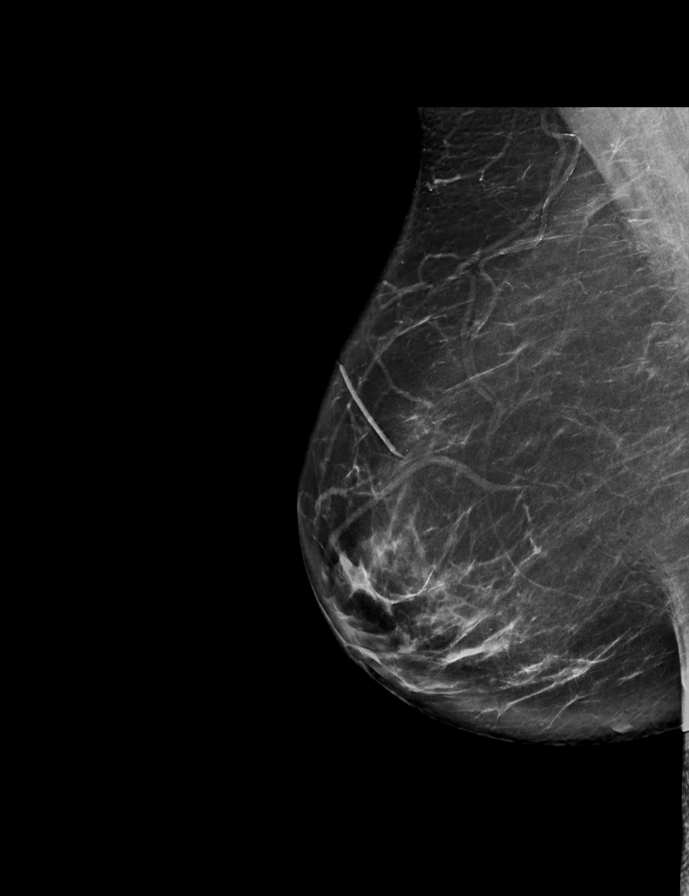

[R CC synth-2D]
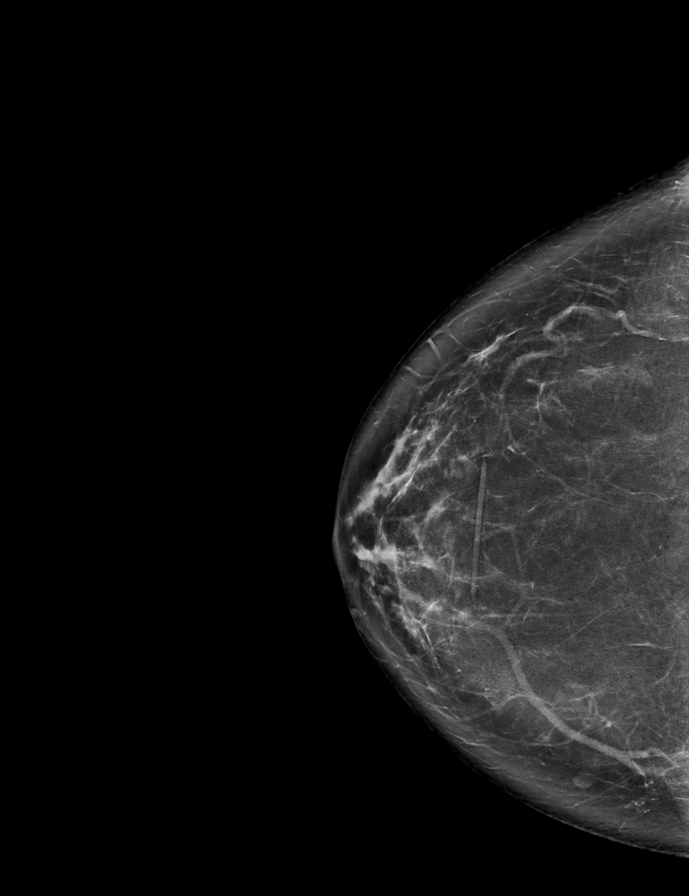

[L CC synth-2D]
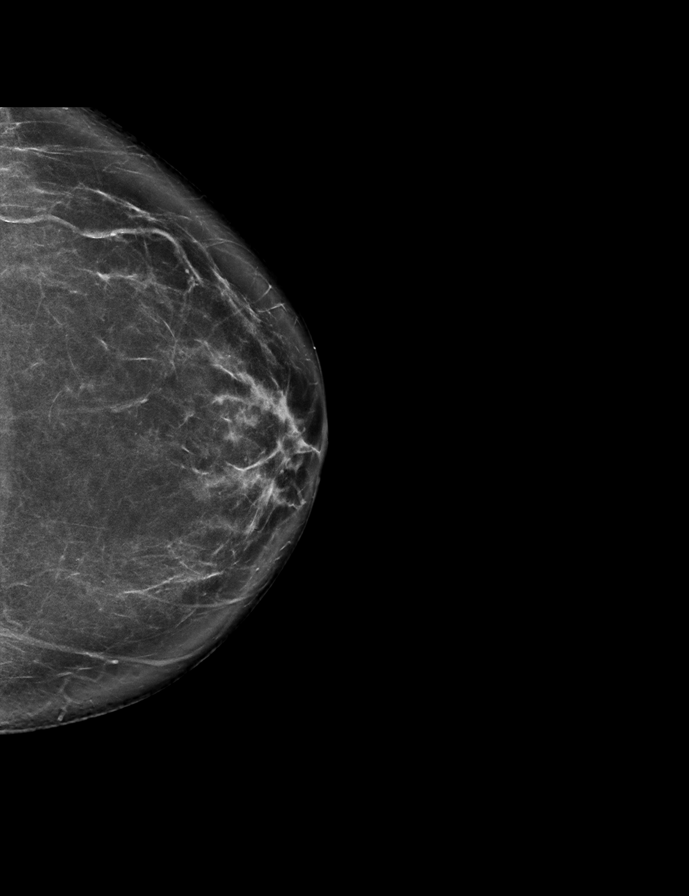

[R CC tomo · 2 of 77 frames shown]
[frame 25/77]
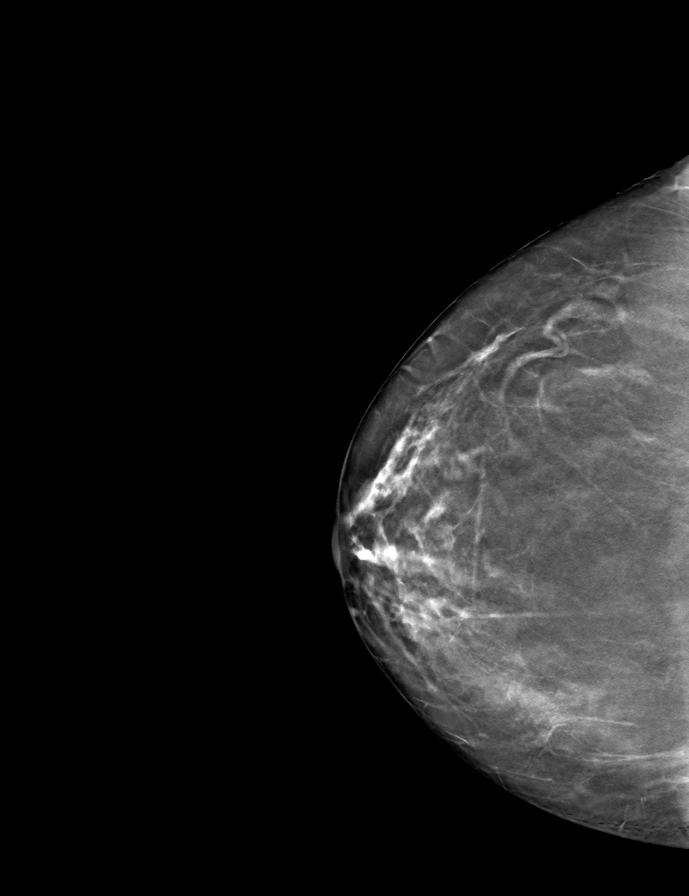
[frame 39/77]
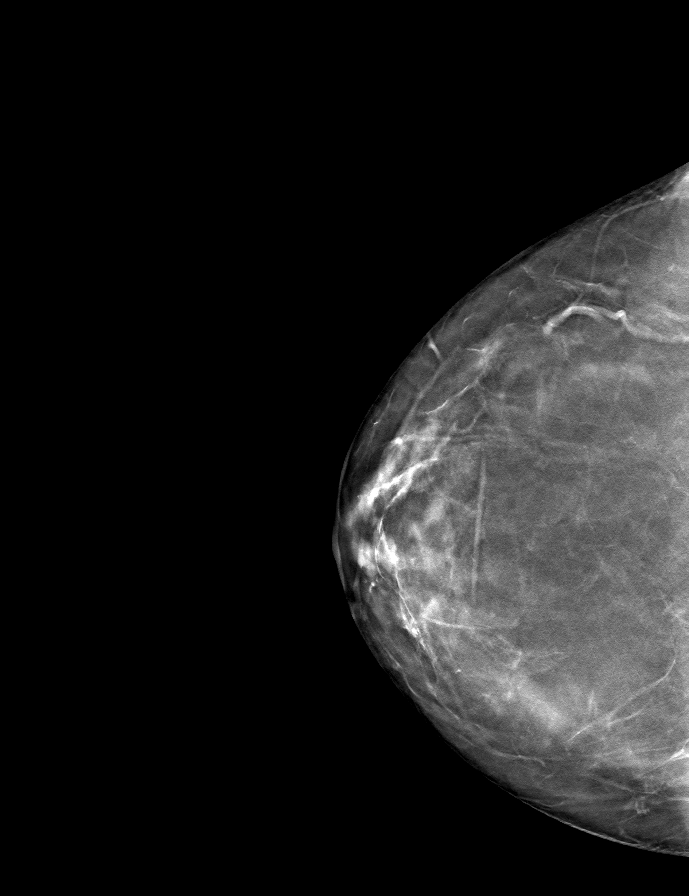

[L MLO tomo · tomo slice 45/89.0]
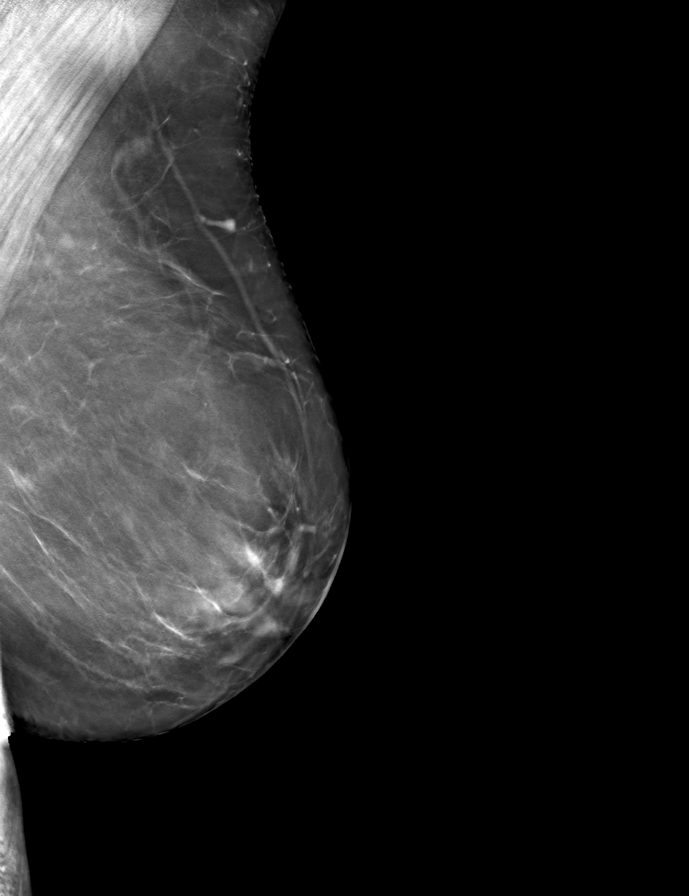

[L CC tomo · tomo slice 40/79.0]
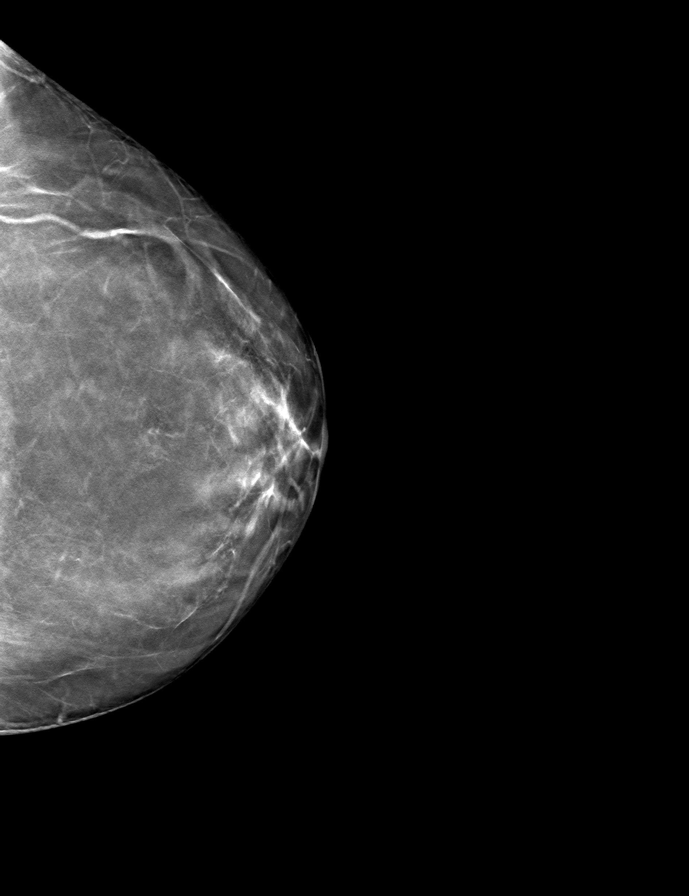

[R MLO tomo · tomo slice 43/86.0]
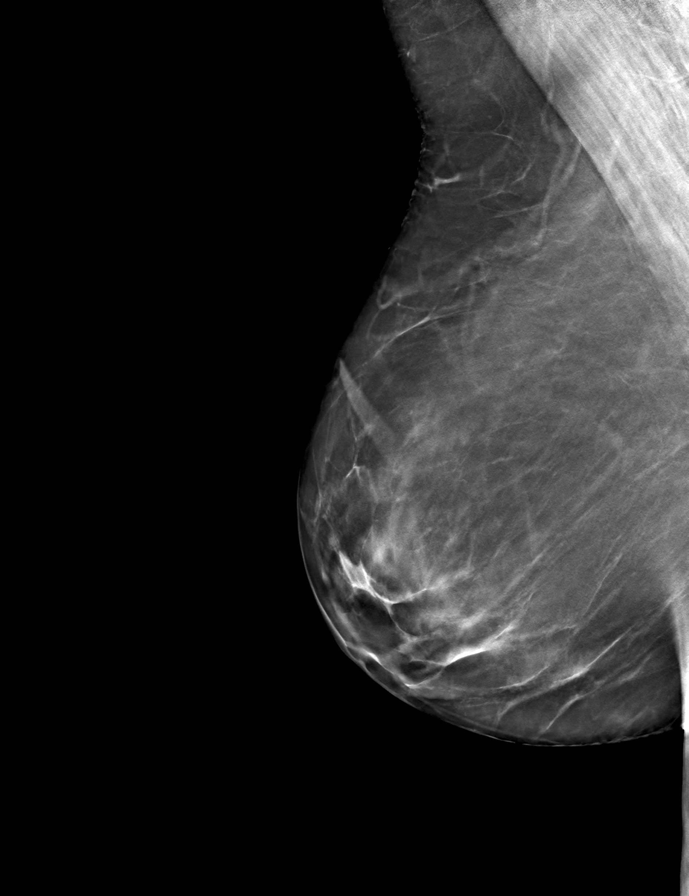

[9 of 24 positions shown; findings below may reference images not displayed]

ACR Breast Density Category b: There are scattered areas of
fibroglandular density.
FINDINGS: There are no findings suspicious for malignancy.
IMPRESSION: No mammographic evidence of malignancy. A result letter of this
screening mammogram will be mailed directly to the patient.

RECOMMENDATION:
Screening mammogram in one year. (Code:51-O-LD2)

BI-RADS CATEGORY  1: Negative.

## 2022-09-04 ENCOUNTER — Other Ambulatory Visit: Payer: Self-pay | Admitting: Nurse Practitioner

## 2022-09-04 DIAGNOSIS — M85851 Other specified disorders of bone density and structure, right thigh: Secondary | ICD-10-CM

## 2022-09-07 ENCOUNTER — Other Ambulatory Visit: Payer: 59

## 2022-09-08 ENCOUNTER — Ambulatory Visit: Payer: 59

## 2022-09-11 ENCOUNTER — Ambulatory Visit: Payer: 59

## 2022-09-15 ENCOUNTER — Ambulatory Visit
Admission: RE | Admit: 2022-09-15 | Discharge: 2022-09-15 | Disposition: A | Discharge status: Home / Self Care | Payer: 59 | Source: Ambulatory Visit | Attending: Physician Assistant | Admitting: Physician Assistant

## 2022-09-15 DIAGNOSIS — Z1231 Encounter for screening mammogram for malignant neoplasm of breast: Secondary | ICD-10-CM

## 2023-01-02 DIAGNOSIS — M8588 Other specified disorders of bone density and structure, other site: Secondary | ICD-10-CM | POA: Diagnosis not present

## 2023-01-02 DIAGNOSIS — M8589 Other specified disorders of bone density and structure, multiple sites: Secondary | ICD-10-CM | POA: Diagnosis not present

## 2023-01-02 DIAGNOSIS — M85851 Other specified disorders of bone density and structure, right thigh: Secondary | ICD-10-CM | POA: Diagnosis not present

## 2023-02-09 ENCOUNTER — Other Ambulatory Visit: Payer: 59

## 2023-03-08 DIAGNOSIS — K219 Gastro-esophageal reflux disease without esophagitis: Secondary | ICD-10-CM | POA: Diagnosis not present

## 2023-03-08 DIAGNOSIS — Z1211 Encounter for screening for malignant neoplasm of colon: Secondary | ICD-10-CM | POA: Diagnosis not present

## 2023-09-17 DIAGNOSIS — K219 Gastro-esophageal reflux disease without esophagitis: Secondary | ICD-10-CM | POA: Diagnosis not present

## 2023-09-17 DIAGNOSIS — E559 Vitamin D deficiency, unspecified: Secondary | ICD-10-CM | POA: Diagnosis not present

## 2023-09-17 DIAGNOSIS — E7800 Pure hypercholesterolemia, unspecified: Secondary | ICD-10-CM | POA: Diagnosis not present

## 2023-09-17 DIAGNOSIS — Z0000 Encounter for general adult medical examination without abnormal findings: Secondary | ICD-10-CM | POA: Diagnosis not present

## 2023-09-17 DIAGNOSIS — M810 Age-related osteoporosis without current pathological fracture: Secondary | ICD-10-CM | POA: Diagnosis not present

## 2023-09-17 DIAGNOSIS — Z79899 Other long term (current) drug therapy: Secondary | ICD-10-CM | POA: Diagnosis not present

## 2023-09-17 DIAGNOSIS — E039 Hypothyroidism, unspecified: Secondary | ICD-10-CM | POA: Diagnosis not present

## 2023-09-17 DIAGNOSIS — R7303 Prediabetes: Secondary | ICD-10-CM | POA: Diagnosis not present

## 2023-09-17 DIAGNOSIS — Z1159 Encounter for screening for other viral diseases: Secondary | ICD-10-CM | POA: Diagnosis not present

## 2023-09-20 DIAGNOSIS — Z1231 Encounter for screening mammogram for malignant neoplasm of breast: Secondary | ICD-10-CM | POA: Diagnosis not present

## 2023-09-20 DIAGNOSIS — R92323 Mammographic fibroglandular density, bilateral breasts: Secondary | ICD-10-CM | POA: Diagnosis not present

## 2023-09-27 DIAGNOSIS — L821 Other seborrheic keratosis: Secondary | ICD-10-CM | POA: Diagnosis not present

## 2023-09-27 DIAGNOSIS — L814 Other melanin hyperpigmentation: Secondary | ICD-10-CM | POA: Diagnosis not present

## 2023-09-27 DIAGNOSIS — D225 Melanocytic nevi of trunk: Secondary | ICD-10-CM | POA: Diagnosis not present

## 2023-09-27 DIAGNOSIS — Z7189 Other specified counseling: Secondary | ICD-10-CM | POA: Diagnosis not present

## 2023-10-15 DIAGNOSIS — Z01419 Encounter for gynecological examination (general) (routine) without abnormal findings: Secondary | ICD-10-CM | POA: Diagnosis not present

## 2023-10-15 DIAGNOSIS — M85851 Other specified disorders of bone density and structure, right thigh: Secondary | ICD-10-CM | POA: Diagnosis not present

## 2024-02-22 DIAGNOSIS — J988 Other specified respiratory disorders: Secondary | ICD-10-CM | POA: Diagnosis not present

## 2024-03-12 DIAGNOSIS — K219 Gastro-esophageal reflux disease without esophagitis: Secondary | ICD-10-CM | POA: Diagnosis not present

## 2024-05-28 DIAGNOSIS — M79675 Pain in left toe(s): Secondary | ICD-10-CM | POA: Diagnosis not present

## 2024-05-28 DIAGNOSIS — K219 Gastro-esophageal reflux disease without esophagitis: Secondary | ICD-10-CM | POA: Diagnosis not present

## 2024-05-28 DIAGNOSIS — L600 Ingrowing nail: Secondary | ICD-10-CM | POA: Diagnosis not present

## 2024-07-14 DIAGNOSIS — L600 Ingrowing nail: Secondary | ICD-10-CM | POA: Diagnosis not present

## 2024-07-14 DIAGNOSIS — R7303 Prediabetes: Secondary | ICD-10-CM | POA: Diagnosis not present

## 2024-09-29 DIAGNOSIS — E559 Vitamin D deficiency, unspecified: Secondary | ICD-10-CM | POA: Diagnosis not present

## 2024-09-29 DIAGNOSIS — E7800 Pure hypercholesterolemia, unspecified: Secondary | ICD-10-CM | POA: Diagnosis not present

## 2024-09-29 DIAGNOSIS — Z1231 Encounter for screening mammogram for malignant neoplasm of breast: Secondary | ICD-10-CM | POA: Diagnosis not present

## 2024-09-29 DIAGNOSIS — M810 Age-related osteoporosis without current pathological fracture: Secondary | ICD-10-CM | POA: Diagnosis not present

## 2024-09-29 DIAGNOSIS — Z23 Encounter for immunization: Secondary | ICD-10-CM | POA: Diagnosis not present

## 2024-09-29 DIAGNOSIS — E041 Nontoxic single thyroid nodule: Secondary | ICD-10-CM | POA: Diagnosis not present

## 2024-09-29 DIAGNOSIS — R7303 Prediabetes: Secondary | ICD-10-CM | POA: Diagnosis not present

## 2024-09-29 DIAGNOSIS — K219 Gastro-esophageal reflux disease without esophagitis: Secondary | ICD-10-CM | POA: Diagnosis not present

## 2024-09-29 DIAGNOSIS — Z0000 Encounter for general adult medical examination without abnormal findings: Secondary | ICD-10-CM | POA: Diagnosis not present

## 2024-09-29 DIAGNOSIS — R92323 Mammographic fibroglandular density, bilateral breasts: Secondary | ICD-10-CM | POA: Diagnosis not present

## 2024-09-29 DIAGNOSIS — Z1331 Encounter for screening for depression: Secondary | ICD-10-CM | POA: Diagnosis not present

## 2024-09-29 DIAGNOSIS — E039 Hypothyroidism, unspecified: Secondary | ICD-10-CM | POA: Diagnosis not present

## 2024-11-04 ENCOUNTER — Ambulatory Visit

## 2024-11-04 DIAGNOSIS — M722 Plantar fascial fibromatosis: Secondary | ICD-10-CM | POA: Diagnosis not present

## 2024-11-04 DIAGNOSIS — L600 Ingrowing nail: Secondary | ICD-10-CM | POA: Diagnosis not present

## 2024-11-04 MED ORDER — CEPHALEXIN 500 MG PO CAPS: 500.0000 mg | ORAL_CAPSULE | Freq: Three times a day (TID) | ORAL | 0 refills | Status: AC

## 2024-11-04 NOTE — Patient Instructions (Addendum)
Place 1/4 cup of epsom salts in a quart of warm tap water.  Submerge your foot or feet in the solution and soak for 20 minutes.  This soak should be done twice a day.  Next, remove your foot or feet from solution, blot dry the affected area. Apply ointment and cover if instructed by your doctor.   IF YOUR SKIN BECOMES IRRITATED WHILE USING THESE INSTRUCTIONS, IT IS OKAY TO SWITCH TO  WHITE VINEGAR AND WATER.  As another alternative soak, you may use antibacterial soap and water.  Monitor for any signs/symptoms of infection. Call the office immediately if any occur or go directly to the emergency room. Call with any questions/concerns. Long Term Care Instructions-Post Nail Surgery  You have had your ingrown toenail and root treated with a chemical.  This chemical causes a burn that will drain and ooze like a blister.  This can drain for 6-8 weeks or longer.  It is important to keep this area clean, covered, and follow the soaking instructions dispensed at the time of your surgery.  This area will eventually dry and form a scab.  Once the scab forms you no longer need to soak or apply a dressing.  If at any time you experience an increase in pain, redness, swelling, or drainage, you should contact the office as soon as possible.   Plantar Fasciitis (Heel Spur Syndrome) with Rehab The plantar fascia is a fibrous, ligament-like, soft-tissue structure that spans the bottom of the foot. Plantar fasciitis is a condition that causes pain in the foot due to inflammation of the tissue. SYMPTOMS   Pain and tenderness on the underneath side of the foot.  Pain that worsens with standing or walking. CAUSES  Plantar fasciitis is caused by irritation and injury to the plantar fascia on the underneath side of the foot. Common mechanisms of injury include:  Direct trauma to bottom of the foot.  Damage to a small nerve that runs under the foot where the main fascia attaches to the heel bone.  Stress placed on  the plantar fascia due to bone spurs. RISK INCREASES WITH:   Activities that place stress on the plantar fascia (running, jumping, pivoting, or cutting).  Poor strength and flexibility.  Improperly fitted shoes.  Tight calf muscles.  Flat feet.  Failure to warm-up properly before activity.  Obesity. PREVENTION  Warm up and stretch properly before activity.  Allow for adequate recovery between workouts.  Maintain physical fitness:  Strength, flexibility, and endurance.  Cardiovascular fitness.  Maintain a health body weight.  Avoid stress on the plantar fascia.  Wear properly fitted shoes, including arch supports for individuals who have flat feet.  PROGNOSIS  If treated properly, then the symptoms of plantar fasciitis usually resolve without surgery. However, occasionally surgery is necessary.  RELATED COMPLICATIONS   Recurrent symptoms that may result in a chronic condition.  Problems of the lower back that are caused by compensating for the injury, such as limping.  Pain or weakness of the foot during push-off following surgery.  Chronic inflammation, scarring, and partial or complete fascia tear, occurring more often from repeated injections.  TREATMENT  Treatment initially involves the use of ice and medication to help reduce pain and inflammation. The use of strengthening and stretching exercises may help reduce pain with activity, especially stretches of the Achilles tendon. These exercises may be performed at home or with a therapist. Your caregiver may recommend that you use heel cups of arch supports to help reduce   stress on the plantar fascia. Occasionally, corticosteroid injections are given to reduce inflammation. If symptoms persist for greater than 6 months despite non-surgical (conservative), then surgery may be recommended.   MEDICATION   If pain medication is necessary, then nonsteroidal anti-inflammatory medications, such as aspirin and ibuprofen,  or other minor pain relievers, such as acetaminophen, are often recommended.  Do not take pain medication within 7 days before surgery.  Prescription pain relievers may be given if deemed necessary by your caregiver. Use only as directed and only as much as you need.  Corticosteroid injections may be given by your caregiver. These injections should be reserved for the most serious cases, because they may only be given a certain number of times.  HEAT AND COLD  Cold treatment (icing) relieves pain and reduces inflammation. Cold treatment should be applied for 10 to 15 minutes every 2 to 3 hours for inflammation and pain and immediately after any activity that aggravates your symptoms. Use ice packs or massage the area with a piece of ice (ice massage).  Heat treatment may be used prior to performing the stretching and strengthening activities prescribed by your caregiver, physical therapist, or athletic trainer. Use a heat pack or soak the injury in warm water.  SEEK IMMEDIATE MEDICAL CARE IF:  Treatment seems to offer no benefit, or the condition worsens.  Any medications produce adverse side effects.  EXERCISES- RANGE OF MOTION (ROM) AND STRETCHING EXERCISES - Plantar Fasciitis (Heel Spur Syndrome) These exercises may help you when beginning to rehabilitate your injury. Your symptoms may resolve with or without further involvement from your physician, physical therapist or athletic trainer. While completing these exercises, remember:   Restoring tissue flexibility helps normal motion to return to the joints. This allows healthier, less painful movement and activity.  An effective stretch should be held for at least 30 seconds.  A stretch should never be painful. You should only feel a gentle lengthening or release in the stretched tissue.  RANGE OF MOTION - Toe Extension, Flexion  Sit with your right / left leg crossed over your opposite knee.  Grasp your toes and gently pull them  back toward the top of your foot. You should feel a stretch on the bottom of your toes and/or foot.  Hold this stretch for 10 seconds.  Now, gently pull your toes toward the bottom of your foot. You should feel a stretch on the top of your toes and or foot.  Hold this stretch for 10 seconds. Repeat  times. Complete this stretch 3 times per day.   RANGE OF MOTION - Ankle Dorsiflexion, Active Assisted  Remove shoes and sit on a chair that is preferably not on a carpeted surface.  Place right / left foot under knee. Extend your opposite leg for support.  Keeping your heel down, slide your right / left foot back toward the chair until you feel a stretch at your ankle or calf. If you do not feel a stretch, slide your bottom forward to the edge of the chair, while still keeping your heel down.  Hold this stretch for 10 seconds. Repeat 3 times. Complete this stretch 2 times per day.   STRETCH  Gastroc, Standing  Place hands on wall.  Extend right / left leg, keeping the front knee somewhat bent.  Slightly point your toes inward on your back foot.  Keeping your right / left heel on the floor and your knee straight, shift your weight toward the wall, not allowing your   back to arch.  You should feel a gentle stretch in the right / left calf. Hold this position for 10 seconds. Repeat 3 times. Complete this stretch 2 times per day.  STRETCH  Soleus, Standing  Place hands on wall.  Extend right / left leg, keeping the other knee somewhat bent.  Slightly point your toes inward on your back foot.  Keep your right / left heel on the floor, bend your back knee, and slightly shift your weight over the back leg so that you feel a gentle stretch deep in your back calf.  Hold this position for 10 seconds. Repeat 3 times. Complete this stretch 2 times per day.  STRETCH  Gastrocsoleus, Standing  Note: This exercise can place a lot of stress on your foot and ankle. Please complete this  exercise only if specifically instructed by your caregiver.   Place the ball of your right / left foot on a step, keeping your other foot firmly on the same step.  Hold on to the wall or a rail for balance.  Slowly lift your other foot, allowing your body weight to press your heel down over the edge of the step.  You should feel a stretch in your right / left calf.  Hold this position for 10 seconds.  Repeat this exercise with a slight bend in your right / left knee. Repeat 3 times. Complete this stretch 2 times per day.   STRENGTHENING EXERCISES - Plantar Fasciitis (Heel Spur Syndrome)  These exercises may help you when beginning to rehabilitate your injury. They may resolve your symptoms with or without further involvement from your physician, physical therapist or athletic trainer. While completing these exercises, remember:   Muscles can gain both the endurance and the strength needed for everyday activities through controlled exercises.  Complete these exercises as instructed by your physician, physical therapist or athletic trainer. Progress the resistance and repetitions only as guided.  STRENGTH - Towel Curls  Sit in a chair positioned on a non-carpeted surface.  Place your foot on a towel, keeping your heel on the floor.  Pull the towel toward your heel by only curling your toes. Keep your heel on the floor. Repeat 3 times. Complete this exercise 2 times per day.  STRENGTH - Ankle Inversion  Secure one end of a rubber exercise band/tubing to a fixed object (table, pole). Loop the other end around your foot just before your toes.  Place your fists between your knees. This will focus your strengthening at your ankle.  Slowly, pull your big toe up and in, making sure the band/tubing is positioned to resist the entire motion.  Hold this position for 10 seconds.  Have your muscles resist the band/tubing as it slowly pulls your foot back to the starting position. Repeat 3  times. Complete this exercises 2 times per day.  Document Released: 12/11/2005 Document Revised: 03/04/2012 Document Reviewed: 03/25/2009 [employer] Patient Information 2014 [employer].  

## 2024-11-04 NOTE — Progress Notes (Unsigned)
 Subjective:   Patient ID: Debbie Cooper, female   DOB: 68 y.o.   MRN: [id number]   HPI Chief Complaint  Patient presents with   Ingrown Toenail    Patient is here for ingrown toenail, patient is also here for overall foot pain has some OTC orthotics   68 year old female presents the office above concerns.  She states that her main concern today is her toenail on her big toe.  She previously had this removed by her primary care doctor but has since grown back causing discomfort.  She states that the nail did come out last year after she went on a trip to [place].  When it grew back and started growing ingrown.  The area is tender.  She previously been on cephalexin which she did well with.  She also secondary concerns overall foot pain.  She gets pain in the arches of her feet.  She does stand the [employer] for work for several hours.  She wears sketchers and she has tried some over-the-counter inserts.  No recent injuries.  This been ongoing for some time.   Review of Systems  All other systems reviewed and are negative.  History reviewed. No pertinent past medical history.  History reviewed. No pertinent surgical history.   Current Outpatient Medications:    cephALEXin (KEFLEX) 500 MG capsule, Take 1 capsule (500 mg total) by mouth 3 (three) times daily., Disp: 21 capsule, Rfl: 0   Cetirizine HCl (ZYRTEC ALLERGY PO), Take by mouth., Disp: , Rfl:    esomeprazole (NEXIUM) 20 MG capsule, , Disp: , Rfl: 3   levothyroxine (SYNTHROID, LEVOTHROID) 75 MCG tablet, , Disp: , Rfl: 0   Multiple Vitamin (MULTIVITAMIN) capsule, Take by mouth., Disp: , Rfl:    rosuvastatin (CRESTOR) 20 MG tablet, Take 20 mg by mouth daily., Disp: , Rfl:   Allergies  Allergen Reactions   Augmentin [Amoxicillin-Pot Clavulanate] Nausea And Vomiting   Doxycycline Nausea And Vomiting          Objective:  Physical Exam  General: AAO x3, NAD  Dermatological: On the hallux toe there is incurvation present both  medial lateral nail borders with localized edema and erythema.  There is no drainage or pus.  There is no ascending cellulitis.  No open lesions.  Vascular: Dorsalis Pedis artery and Posterior Tibial artery pedal pulses are 2/4 bilateral with immedate capillary fill time. There is no pain with calf compression, swelling, warmth, erythema.   Neruologic: Grossly intact via light touch bilateral.   Musculoskeletal: There is tenderness palpation of the plantar aspect the right heel and insertion of plantar fascia but there is tenderness in the arch of the foot bilaterally.  Not able to appreciate any area of pinpoint tenderness.  Flexor, extensor tendons intact.  MMT 5/5.       Assessment:   68 year old female with ingrown toenail left hallux, bilateral plantar fasciitis     Plan:  -Treatment options discussed including all alternatives, risks, and complications -Etiology of symptoms were discussed -At this time, the patient is requesting partial nail removal with chemical matricectomy to the symptomatic portion of the nail. Risks and complications were discussed with the patient for which they understand and written consent was obtained. Under sterile conditions a total of 3 mL of a mixture of 2% lidocaine plain and 0.5% Marcaine plain was infiltrated in a hallux block fashion. Once anesthetized, the skin was prepped in sterile fashion. A tourniquet was then applied. Next the medial, lateral aspect of hallux  nail border was then sharply excised making sure to remove the entire offending nail border. Once the nails were ensured to be removed area was debrided and the underlying skin was intact. There is no purulence identified in the procedure. Next phenol was then applied under standard conditions and copiously irrigated.  Silvadene was applied. A dry sterile dressing was applied. After application of the dressing the tourniquet was removed and there is found to be an immediate capillary refill time  to the digit. The patient tolerated the procedure well any complications. Post procedure instructions were discussed the patient for which he verbally understood. Discussed signs/symptoms of infection and directed to call the office immediately should any occur or go directly to the emergency room. In the meantime, encouraged to call the office with any questions, concerns, changes symptoms. - Regards to the bilateral foot pain this is more chronic than plantar fasciitis.  Discussed anti-inflammatories as needed, icing regular.  Discussed exercises and we discussed inserts.  She is interested in custom we will check insurance, cost.     Return if symptoms worsen or fail to improve.  Donnice JONELLE Fees DPM

## 2024-11-10 DIAGNOSIS — D1801 Hemangioma of skin and subcutaneous tissue: Secondary | ICD-10-CM | POA: Diagnosis not present

## 2024-11-10 DIAGNOSIS — L821 Other seborrheic keratosis: Secondary | ICD-10-CM | POA: Diagnosis not present

## 2024-11-10 DIAGNOSIS — L814 Other melanin hyperpigmentation: Secondary | ICD-10-CM | POA: Diagnosis not present
